# Patient Record
Sex: Female | Born: 1980 | Race: Black or African American | Hispanic: No | Smoking: Never smoker
Health system: Southern US, Community
[De-identification: ages and names within clinical notes are randomized; demographics above are authoritative.]

## PROBLEM LIST (undated history)

## (undated) DIAGNOSIS — L309 Dermatitis, unspecified: Secondary | ICD-10-CM

## (undated) HISTORY — PX: BUNIONECTOMY: SHX129

---

## 2005-08-08 HISTORY — PX: INDUCED ABORTION: SHX677

## 2014-12-19 ENCOUNTER — Ambulatory Visit
Admission: EM | Admit: 2014-12-19 | Discharge: 2014-12-19 | Disposition: A | Discharge status: Home / Self Care | Payer: BC Managed Care – PPO | Attending: Family Medicine | Admitting: Family Medicine

## 2014-12-19 DIAGNOSIS — N63 Unspecified lump in unspecified breast: Secondary | ICD-10-CM

## 2014-12-19 DIAGNOSIS — N6323 Unspecified lump in the left breast, lower outer quadrant: Secondary | ICD-10-CM

## 2014-12-19 DIAGNOSIS — N644 Mastodynia: Secondary | ICD-10-CM | POA: Diagnosis present

## 2014-12-19 HISTORY — DX: Dermatitis, unspecified: L30.9

## 2014-12-19 LAB — PREGNANCY, URINE: Preg Test, Ur: NEGATIVE

## 2014-12-19 NOTE — ED Notes (Addendum)
States when bent over 3 days ago and left breast "felt like a marble in it". + Painful. Previously negative mammogram. No discharge from nipple

## 2014-12-19 NOTE — ED Provider Notes (Addendum)
CSN: [id number]     Arrival date & time 12/19/14  31 History   First MD Initiated Contact with Patient 12/19/14 1920     Chief Complaint  Patient presents with  . Breast Pain   (Consider location/radiation/quality/duration/timing/severity/associated sxs/prior Treatment) HPI Comments: African [place] female new palpable lump left breast x 3 days.  Mid-menstrual cycle menses due in 2 weeks.  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  Reports left breast heaviness and discomfort with moving to her left and bending forward.  Unsure if previous mammogram.  Had regular women's exams with PCM in [place], [place] recently moved to [facility] and had not established new PCM.  Denied fever, chills, rash, erythema, fatigue, muscle aches, joint aches, swelling, personal or family history of fibrocystic breast disease, unknown cancer type in paternal aunt  The history is provided by the patient.    Past Medical History  Diagnosis Date  . Eczema    Past Surgical History  Procedure Laterality Date  . Bunionectomy    . Induced abortion  2007   History reviewed. No pertinent family history. History  Substance Use Topics  . Smoking status: Never Smoker   . Smokeless tobacco: Not on file  . Alcohol Use: Yes     Comment: socially   OB History    No data available     Review of Systems  Constitutional: Negative for fever, chills, diaphoresis, activity change, appetite change and fatigue.  HENT: Negative for mouth sores and trouble swallowing.   Eyes: Negative for photophobia, pain, discharge, redness, itching and visual disturbance.  Respiratory: Negative for cough, chest tightness, shortness of breath, wheezing and stridor.   Cardiovascular: Negative for chest pain, palpitations and leg swelling.  Gastrointestinal: Negative for nausea, diarrhea and constipation.  Endocrine: Negative for polydipsia, polyphagia and polyuria.  Genitourinary: Negative for  dysuria, vaginal bleeding, vaginal discharge, difficulty urinating and vaginal pain.  Musculoskeletal: Negative for myalgias, back pain, joint swelling, arthralgias, gait problem, neck pain and neck stiffness.  Skin: Negative for color change, pallor, rash and wound.  Allergic/Immunologic: Negative for environmental allergies and food allergies.  Neurological: Negative for syncope, light-headedness and headaches.  Hematological: Negative for adenopathy. Does not bruise/bleed easily.  Psychiatric/Behavioral: Negative for behavioral problems, confusion, sleep disturbance and agitation.    Allergies  Penicillins and Sulfa antibiotics  Home Medications   Prior to Admission medications   Medication Sig Start Date End Date Taking? Authorizing Provider  Calcium Carbonate-Vitamin D (CALCIUM-VITAMIN D) 500-200 MG-UNIT per tablet Take 1 tablet by mouth daily.   Yes Historical Provider, MD   BP 147/92 mmHg  Pulse 70  Temp(Src) 97.1 F (36.2 C) (Tympanic)  Resp 16  Ht 5\' 2"  (1.575 m)  Wt 205 lb (92.987 kg)  BMI 37.49 kg/m2  SpO2 100%  LMP 12/04/2014 (Approximate) Physical Exam  Constitutional: She is oriented to person, place, and time. Vital signs are normal. She appears well-developed and well-nourished. No distress.  HENT:  Head: Normocephalic and atraumatic.  Mouth/Throat: Oropharynx is clear and moist.  Eyes: Conjunctivae, EOM and lids are normal. Pupils are equal, round, and reactive to light.  Neck: Trachea normal and normal range of motion. Neck supple. No JVD present. No tracheal deviation present. No thyromegaly present.  Cardiovascular: Normal rate, regular rhythm, normal heart sounds and intact distal pulses.   Pulmonary/Chest: Effort normal and breath sounds normal. No respiratory distress. She has no wheezes. She has no rales. She exhibits no tenderness.  Abdominal: Soft. She exhibits no distension. There is no tenderness.  Genitourinary: There is breast tenderness. No breast  swelling, discharge or bleeding. Pelvic exam was performed with patient supine.  Left breast TTP 5 oclock position 1cm nodule mobile  Musculoskeletal: Normal range of motion. She exhibits no edema or tenderness.  Lymphadenopathy:    She has no cervical adenopathy.  Neurological: She is alert and oriented to person, place, and time.  Skin: Skin is warm, dry and intact. No rash noted. She is not diaphoretic. No erythema. No pallor.  Psychiatric: She has a normal mood and affect. Her speech is normal and behavior is normal. Judgment and thought content normal. Cognition and memory are normal.  Nursing note and vitals reviewed.   ED Course  Procedures (including critical care time) Labs Review Labs Reviewed  PREGNANCY, URINE    Imaging Review No results found. Discussed with patient probable breast cyst but definitive diagnosis can only occur with mammogram and/or ultrasound.  Unable to perform diagnostic mammograms at this location and scheduling closed until 22 Dec 2014.  Nurse will contact mammogram scheduling [facility] 16 May to schedule left diagnostic mammogram for patient and contact her with date/time. At [phone].  Discussed with patient if lump decreases in size after menstruation and decreasing caffeine intake probably benign/fibrocystic breast disease.  Consider breast surgeon referral if lump persists.  Patient given Dr Addison Bailey card as he is accepting new patient to establish PCM in local area.  Differential diagnosis malignancy, mastitis.  Discussed mastitis signs and symptoms with patient offered ceftin Rx and she refused at this time.  Patient to follow up for re-evaluation if erythema breast, nipple discharge, fever, myalgias, arthralgias, shortness of breath.  Patient verbalized understanding of information/instructions, agreed with plan of care and had no further questions at this time.  MDM   1. Breast lump on left side at 5 o'clock position   2. Lump in female breast    22 Dec 2014 0820 Telephone message left for patient diagnostic mammogram and ultrasound scheduled for 23 Dec 2014 at [place] and if she needs to change appt date/time to call [phone] to change appt time/date today and to contact me to confirm message receipt.  26 Dec 2014 at 0810 Telephone message left for patient to contact clinic 4.24mm likely benign cyst left breast recommend follow up US with PCM in 6 months.  Patient may request copy of report and I will leave at front desk and copy of imaging may be requesting from [facility] if needed.  Olen Cordial, NP 12/19/14 2043  Olen Cordial, NP 12/22/14 9833  Olen Cordial, NP 12/26/14 [phone]  Contacted patient via telephone discussed mammogram and ultrasound results (probably benign cyst diamester 4.39mm) with patient and recommend that patient has repeat US/mammogram in 6 months.  Continue self-breast exam and if changing in size follow up for re-evaluation sooner.  Discussed size may increase and decrease with menstrual cycle and caffeine intake.  Patient verbalized understanding of information/instructions, agreed with plan of care and had no further questions at this time.  Olen Cordial, NP 01/01/15 [phone]

## 2014-12-19 NOTE — Discharge Instructions (Signed)
Breast Cyst A breast cyst is a sac in the breast that is filled with fluid. Breast cysts are common in women. Women can have one or many cysts. When the breasts contain many cysts, it is usually due to a noncancerous (benign) condition called fibrocystic change. These lumps form under the influence of female hormones (estrogen and progesterone). The lumps are most often located in the upper, outer portion of the breast. They are often more swollen, painful, and tender before your period starts. They usually disappear after menopause, unless you are on hormone therapy.  There are several types of cysts:  Macrocyst. This is a cyst that is about 2 in. (5.1 cm) in diameter.   Microcyst. This is a tiny cyst that you cannot feel but can be seen with a mammogram or an ultrasound.   Galactocele. This is a cyst containing milk that may develop if you suddenly stop breastfeeding.   Sebaceous cyst of the skin. This type of cyst is not in the breast tissue itself. Breast cysts do not increase your risk of breast cancer. However, they must be monitored closely because they can be cancerous.  CAUSES  It is not known exactly what causes a breast cyst to form. Possible causes include:  An overgrowth of milk glands and connective tissue in the breast can block the milk glands, causing them to fill with fluid.   Scar tissue in the breast from previous surgery may block the glands, causing a cyst.  RISK FACTORS Estrogen may influence the development of a breast cyst.  SIGNS AND SYMPTOMS   Feeling a smooth, round, soft lump (like a grape) in the breast that is easily moveable.   Breast discomfort or pain.  Increase in size of the lump before your menstrual period and decrease in its size after your menstrual period.  DIAGNOSIS  A cyst can be felt during a physical exam by your health care provider. A breast X-ray exam (mammogram) and ultrasonography will be done to confirm the diagnosis. Fluid may  be removed from the cyst with a needle (fine needle aspiration) to make sure the cyst is not cancerous.  TREATMENT  Treatment may not be necessary. Your health care provider may monitor the cyst to see if it goes away on its own. If treatment is needed, it may include:  Hormone treatment.   Needle aspiration. There is a chance of the cyst coming back after aspiration.   Surgery to remove the whole cyst.  HOME CARE INSTRUCTIONS   Keep all follow-up appointments with your health care provider.  See your health care provider regularly:  Get a yearly exam by your health care provider.  Have a clinical breast exam by a health care provider every 1-3 years if you are 4-49 years of age. After age 70 years, you should have the exam every year.   Get mammogram tests as directed by your health care provider.   Understand the normal appearance and feel of your breasts and perform breast self-exams.   Only take over-the-counter or prescription medicines as directed by your health care provider.   Wear a supportive bra, especially when exercising.   Avoid caffeine.   Reduce your salt intake, especially before your menstrual period. Too much salt can cause fluid retention, breast swelling, and discomfort.  SEEK MEDICAL CARE IF:   You feel, or think you feel, a lump in your breast.   You notice that both breasts look or feel different than usual.   Your  breast is still causing pain after your menstrual period is over.   You need medicine for breast pain and swelling that occurs with your menstrual period.  SEEK IMMEDIATE MEDICAL CARE IF:   You have severe pain, tenderness, redness, or warmth in your breast.   You have nipple discharge or bleeding.   Your breast lump becomes hard and painful.   You find new lumps or bumps that were not there before.   You feel lumps in your armpit (axilla).   You notice dimpling or wrinkling of the breast or nipple.   You  have a fever.  MAKE SURE YOU:  Understand these instructions.  Will watch your condition.  Will get help right away if you are not doing well or get worse. Document Released: 07/25/2005 Document Revised: 03/27/2013 Document Reviewed: 02/21/2013 [facility] Patient Information 2015 [place], [place]. This information is not intended to replace advice given to you by your health care provider. Make sure you discuss any questions you have with your health care provider. PATIENT INSTRUCTIONS FIBROCYSTIC BREAST DISEASE  FOLLOW-UP:  Please make an appointment with your physician in 7 day(s).  Call your physician should you develop a new breast mass that is different, if one particular lump starts to enlarge, or if nipple discharge develops.   CAUSE:  Many women have some lumpiness within their breasts and these areas at times can become tender during certain times in your menstrual cycle.  These areas tend to feel like a firm rubber ball as compared to a cancer which will more commonly feel hard and almost rock-like.  Fibrocystic breast disease does not in and of itself increase your risk for breast cancer but you should be sure to examine yiour breasts at the same time of the month on a monthly basis.  If there are a lot of areas of lumpiness you should tape a piece of paper on the mirror with a diagram of your breasts, noting where the areas of lumpiness are and their relative size.  You can then refer to this diagram on a monthly basis to keep better track of any changes should they occur.  DIET:  You should try and avoid foods, or at least minimize foods, such as chocolate and caffeine which may cause the symptoms of tenderness to become worse.  ACTIVITY:  You may want to wear a bra that offers additional support, and/or consider a sports bra, especially during those times when your breasts are more tender.  MEDICATIONS:  Taking Vitamin E capsules twice a day along with Evening of Primrose Capsules  three times a day, or as directed on the bottle, may help your symptoms.  These are both available over-the-counter and without a prescription. There is clinical evidence that these may help symptoms in some patients.   If your physician has prescribed medication for your fibrocystic breast disease, be sure to take it as instructed on the bottle and let him/her know if you have any side effects.  QUESTIONS:  Please feel free to call your physician  if you have any questions, and they will be glad to assist you.

## 2014-12-23 ENCOUNTER — Ambulatory Visit: Admission: RE | Admit: 2014-12-23 | Payer: BC Managed Care – PPO | Source: Ambulatory Visit

## 2014-12-23 ENCOUNTER — Ambulatory Visit
Admission: RE | Admit: 2014-12-23 | Discharge: 2014-12-23 | Disposition: A | Discharge status: Home / Self Care | Payer: BC Managed Care – PPO | Source: Ambulatory Visit | Attending: Registered Nurse | Admitting: Registered Nurse

## 2014-12-23 DIAGNOSIS — N63 Unspecified lump in breast: Secondary | ICD-10-CM | POA: Insufficient documentation

## 2019-10-05 ENCOUNTER — Ambulatory Visit: Payer: BC Managed Care – PPO | Attending: Internal Medicine

## 2019-10-05 ENCOUNTER — Ambulatory Visit: Payer: BC Managed Care – PPO

## 2019-10-05 DIAGNOSIS — Z23 Encounter for immunization: Secondary | ICD-10-CM

## 2019-10-05 NOTE — Progress Notes (Signed)
   Covid-19 Vaccination Clinic  Name:  Sheila Martinez    MRN: [id number] DOB: 19-May-1981  10/05/2019  [name] was observed post Covid-19 immunization for 15 minutes without incidence. She was provided with Vaccine Information Sheet and instruction to access the V-Safe system.   [name] was instructed to call 911 with any severe reactions post vaccine: Marland Kitchen Difficulty breathing  . Swelling of your face and throat  . A fast heartbeat  . A bad rash all over your body  . Dizziness and weakness    Immunizations Administered    Name Date Dose VIS Date Route   Moderna COVID-19 Vaccine 10/05/2019  4:13 PM 0.5 mL 07/09/2019 Intramuscular   Manufacturer: Moderna   Lot: [id number]   [id number]

## 2019-11-02 ENCOUNTER — Ambulatory Visit: Payer: BC Managed Care – PPO | Attending: Internal Medicine

## 2019-11-02 DIAGNOSIS — Z23 Encounter for immunization: Secondary | ICD-10-CM

## 2020-10-15 ENCOUNTER — Other Ambulatory Visit: Payer: Self-pay

## 2020-10-15 DIAGNOSIS — D259 Leiomyoma of uterus, unspecified: Secondary | ICD-10-CM | POA: Diagnosis not present

## 2020-10-15 DIAGNOSIS — R102 Pelvic and perineal pain: Secondary | ICD-10-CM | POA: Diagnosis present

## 2020-10-15 LAB — URINALYSIS, COMPLETE (UACMP) WITH MICROSCOPIC
Bacteria, UA: NONE SEEN
Bilirubin Urine: NEGATIVE
Glucose, UA: NEGATIVE mg/dL
Hgb urine dipstick: NEGATIVE
Ketones, ur: NEGATIVE mg/dL
Leukocytes,Ua: NEGATIVE
Nitrite: NEGATIVE
Protein, ur: NEGATIVE mg/dL
Specific Gravity, Urine: 1.013 (ref 1.005–1.030)
pH: 7 (ref 5.0–8.0)

## 2020-10-15 LAB — COMPREHENSIVE METABOLIC PANEL
ALT: 15 U/L (ref 0–44)
AST: 23 U/L (ref 15–41)
Albumin: 4.3 g/dL (ref 3.5–5.0)
Alkaline Phosphatase: 77 U/L (ref 38–126)
Anion gap: 12 (ref 5–15)
BUN: 15 mg/dL (ref 6–20)
CO2: 24 mmol/L (ref 22–32)
Calcium: 9.3 mg/dL (ref 8.9–10.3)
Chloride: 100 mmol/L (ref 98–111)
Creatinine, Ser: 0.94 mg/dL (ref 0.44–1.00)
GFR, Estimated: >60 mL/min (ref >60)
Glucose, Bld: 119 mg/dL — ABNORMAL HIGH (ref 70–99)
Potassium: 3.3 mmol/L — ABNORMAL LOW (ref 3.5–5.1)
Sodium: 136 mmol/L (ref 135–145)
Total Bilirubin: 0.5 mg/dL (ref 0.3–1.2)
Total Protein: 8.2 g/dL — ABNORMAL HIGH (ref 6.5–8.1)

## 2020-10-15 LAB — CBC
HCT: 41.4 % (ref 36.0–46.0)
Hemoglobin: 13 g/dL (ref 12.0–15.0)
MCH: 25.7 pg — ABNORMAL LOW (ref 26.0–34.0)
MCHC: 31.4 g/dL (ref 30.0–36.0)
MCV: 81.8 fL (ref 80.0–100.0)
Platelets: 335 10*3/uL (ref 150–400)
RBC: 5.06 MIL/uL (ref 3.87–5.11)
RDW: 14.6 % (ref 11.5–15.5)
WBC: 6.3 10*3/uL (ref 4.0–10.5)
nRBC: 0 % (ref 0.0–0.2)

## 2020-10-15 LAB — POC URINE PREG, ED: Preg Test, Ur: NEGATIVE

## 2020-10-15 NOTE — ED Triage Notes (Signed)
Pt in with RLQ cramping during her menses monthly. States started in September and has progressively gotten worse. Pt on cycle now and having same pain.

## 2020-10-16 ENCOUNTER — Emergency Department: Payer: BC Managed Care – PPO

## 2020-10-16 ENCOUNTER — Emergency Department
Admission: EM | Admit: 2020-10-16 | Discharge: 2020-10-16 | Disposition: A | Discharge status: Home / Self Care | Payer: BC Managed Care – PPO | Attending: Emergency Medicine | Admitting: Emergency Medicine

## 2020-10-16 DIAGNOSIS — D259 Leiomyoma of uterus, unspecified: Secondary | ICD-10-CM

## 2020-10-16 DIAGNOSIS — R1031 Right lower quadrant pain: Secondary | ICD-10-CM

## 2020-10-16 DIAGNOSIS — N946 Dysmenorrhea, unspecified: Secondary | ICD-10-CM

## 2020-10-16 MED ORDER — KETOROLAC TROMETHAMINE 10 MG PO TABS: 10.0000 mg | ORAL_TABLET | Freq: Four times a day (QID) | ORAL | 0 refills | Status: AC | PRN

## 2020-10-16 MED ORDER — KETOROLAC TROMETHAMINE 30 MG/ML IJ SOLN
30.0000 mg | Freq: Once | INTRAMUSCULAR | Status: AC
  Administered 2020-10-16: 30 mg via INTRAVENOUS
  Filled 2020-10-16: qty 1

## 2020-10-16 NOTE — ED Provider Notes (Signed)
[facility] Emergency Department Provider Note  ____________________________________________   Event Date/Time   First MD Initiated Contact with Patient 10/16/20 0216     (approximate)  I have reviewed the triage vital signs and the nursing notes.   HISTORY  Chief Complaint Abdominal Pain    HPI Sheila Martinez is a 40 y.o. female with history of eczema who presents to the emergency department with right-sided pelvic pain.  She states since she was 40 years old she has had lower abdominal pain with her menstrual cycles but since September 2021 she has had severe right lower quadrant abdominal pain that has progressively gotten worse with her menstrual cycles.  She states she is having a menstrual cycle now.  Her cycles are regular.  She denies any discharge.  No fevers, vomiting, diarrhea.  Does have some dysuria.  States she saw her OB/GYN with [facility] in December and had negative STD screening.  She states she has been taking ibuprofen 600 mg for pain without relief.  She states that she is having pain with urinating, walking and is uncontrollable.  She states she is having a hard time working due to pain.  She denies any previous history of endometriosis.  On review of her records, it appears she saw her primary care physician Dr. Sheral Flow on 07/28/2020 and patient was treated with Macrobid for UTI.  She saw Dagoberto Reef, certified nurse midwife on 08/31/2020 for vulvar itching.  They discussed prophylactic boric acid for BV.  Her wet prep at that time was negative.  UA showed many bacteria with a culture positive for Proteus.  It is unclear if she was ever started on antibiotics.  It appears she has had trichomonas in 2020.  She has history of HSV.  She had an abnormal Pap smear in January 2021 that was  LSIL with negative HPV>  she is a G2 P2.  She states she is not concerned for STDs.  She is monogamous with one female partner.        Past Medical  History:  Diagnosis Date  . Eczema     There are no problems to display for this patient.   Past Surgical History:  Procedure Laterality Date  . BUNIONECTOMY    . INDUCED ABORTION  2007    Prior to Admission medications   Medication Sig Start Date End Date Taking? Authorizing Provider  ketorolac (TORADOL) 10 MG tablet Take 1 tablet (10 mg total) by mouth every 6 (six) hours as needed. Take with food. 10/16/20  Yes Kreston Ahrendt, Delice Bison, DO  Calcium Carbonate-Vitamin D (CALCIUM-VITAMIN D) 500-200 MG-UNIT per tablet Take 1 tablet by mouth daily.    [provider]    Allergies Penicillins and Sulfa antibiotics  No family history on file.  Social History Social History   Tobacco Use  . Smoking status: Never Smoker  Substance Use Topics  . Alcohol use: Yes    Comment: socially    Review of Systems Constitutional: No fever. Eyes: No visual changes. ENT: No sore throat. Cardiovascular: Denies chest pain. Respiratory: Denies shortness of breath. Gastrointestinal: No nausea, vomiting, diarrhea. Genitourinary: Negative for dysuria. Musculoskeletal: Negative for back pain. Skin: Negative for rash. Neurological: Negative for focal weakness or numbness.  ____________________________________________   PHYSICAL EXAM:  VITAL SIGNS: ED Triage Vitals  Enc Vitals Group     BP 10/15/20 1928 (!) 160/111     Pulse Rate 10/15/20 1928 98     Resp 10/15/20 1928 20  Temp 10/15/20 1928 98.7 F (37.1 C)     Temp Source 10/15/20 1928 Oral     SpO2 10/15/20 1928 100 %     Weight 10/15/20 1929 205 lb (93 kg)     Height 10/15/20 1929 5\' 3"  (1.6 m)     Head Circumference --      Peak Flow --      Pain Score 10/15/20 1928 6     Pain Loc --      Pain Edu? --      Excl. in [place]? --    CONSTITUTIONAL: Alert and oriented and responds appropriately to questions. Well-appearing; well-nourished, tearful after abdominal exam HEAD: Normocephalic EYES: Conjunctivae clear, pupils  appear equal, EOM appear intact ENT: normal nose; moist mucous membranes NECK: Supple, normal ROM CARD: RRR; S1 and S2 appreciated; no murmurs, no clicks, no rubs, no gallops RESP: Normal chest excursion without splinting or tachypnea; breath sounds clear and equal bilaterally; no wheezes, no rhonchi, no rales, no hypoxia or respiratory distress, speaking full sentences ABD/GI: Normal bowel sounds; non-distended; soft, ender to palpation in the right lower quadrant without guarding or rebound GU: Deferred per patient request. BACK: The back appears normal EXT: Normal ROM in all joints; no deformity noted, no edema; no cyanosis SKIN: Normal color for age and race; warm; no rash on exposed skin NEURO: Moves all extremities equally PSYCH: The patient's mood and manner are appropriate.  ____________________________________________   LABS (all labs ordered are listed, but only abnormal results are displayed)  Labs Reviewed  CBC - Abnormal; Notable for the following components:      Result Value   MCH 25.7 (*)    All other components within normal limits  COMPREHENSIVE METABOLIC PANEL - Abnormal; Notable for the following components:   Potassium 3.3 (*)    Glucose, Bld 119 (*)    Total Protein 8.2 (*)    All other components within normal limits  URINALYSIS, COMPLETE (UACMP) WITH MICROSCOPIC - Abnormal; Notable for the following components:   Color, Urine STRAW (*)    APPearance CLEAR (*)    All other components within normal limits  URINE CULTURE  POC URINE PREG, ED   ____________________________________________  EKG  none ____________________________________________  RADIOLOGY I, Rosalina Dingwall, personally viewed and evaluated these images (plain radiographs) as part of my medical decision making, as well as reviewing the written report by the radiologist.  ED MD interpretation: Uterine fibroids.  Official radiology report(s): US PELVIC COMPLETE W TRANSVAGINAL AND TORSION  R/O  Result Date: 10/16/2020 CLINICAL DATA:  Right pelvic pain, LMP 10/15/2020 EXAM: TRANSABDOMINAL AND TRANSVAGINAL ULTRASOUND OF PELVIS DOPPLER ULTRASOUND OF OVARIES TECHNIQUE: Both transabdominal and transvaginal ultrasound examinations of the pelvis were performed. Transabdominal technique was performed for global imaging of the pelvis including uterus, ovaries, adnexal regions, and pelvic cul-de-sac. It was necessary to proceed with endovaginal exam following the transabdominal exam to visualize the endometrium. Color and duplex Doppler ultrasound was utilized to evaluate blood flow to the ovaries. COMPARISON:  None. FINDINGS: Uterus Measurements: 11.8 x 8.0 x 14.3 cm = volume: 703 mL. The uterus is enlarged and demonstrates lobular contour with multiple intrauterine heterogeneously hypoechoic masses identified in keeping with multiple uterine fibroids. The dominant mass is seen within the fundus measuring 10.0 x 5.8 x 7.3 cm. A second large intramural/exophytic mass is seen measuring 7.9 x 5.8 x 7.2 cm. Multiple nabothian cysts are seen within the cervix. Endometrium Thickness: 6 mm. The endometrial stripe is obscured within the  fundus secondary to multiple uterine fibroids. Within the mid body and lower uterine segment, the endometrial stripe appears uniform. Right ovary Measurements: 4.3 x 2.4 x 2.9 cm = volume: 15 mL. Normal appearance/no adnexal mass. Left ovary Measurements: 3.5 x 1.4 x 2.8 cm = volume: 7 mL. Normal appearance/no adnexal mass. Pulsed Doppler evaluation of both ovaries demonstrates normal low-resistance arterial and venous waveforms. Other findings No abnormal free fluid. IMPRESSION: Multiple uterine fibroids resulting in distortion of the uterine fundus. Electronically Signed   By: Fidela Salisbury MD   On: 10/16/2020 05:14    ____________________________________________   PROCEDURES  Procedure(s) performed (including Critical  Care):  Procedures  ____________________________________________   INITIAL IMPRESSION / ASSESSMENT AND PLAN / ED COURSE  As part of my medical decision making, I reviewed the following data within the [place] notes reviewed and incorporated, Labs reviewed , Old chart reviewed and Notes from prior ED visits         Patient here with chronic pelvic pain.  Will obtain transvaginal ultrasound today to rule out torsion given worsening pain at this time.  Recent negative STD screening 08/03/2020.  Low suspicion for TOA.  Pregnancy test today is negative.  She did have a positive urine culture on August 31, 2020.  Urine grew Proteus mirabilis but unclear if she was ever on antibiotics.  Her urine does not appear infected today and she is not describing significant urinary tract infection symptoms.  Seems to be more chronic pelvic pain worse with menstrual cycles.  Discussed with patient that this could also be endometriosis and have recommended continued close follow-up with her OB/GYN.  Will give Toradol for pain and reassess.  Symptoms very atypical for appendicitis.  Doubt kidney stone.  ED PROGRESS  5:08 AM  Pt reports significant pain relief with ketorolac.  Ultrasound results pending.  5:21 AM  Pt's ultrasound shows multiple uterine fibroids.  Ovaries show no adnexal masses, lesions with good blood flow to both ovaries.  Suspect fibroids are causing her pain with her menstrual cycles.  Have recommended she follow-up with her OB/GYN.  Given ketorolac help with her pain significantly, will discharge with prescription of the same.  Discussed return precautions.  Patient comfortable with this plan.   At this time, I do not feel there is any life-threatening condition present. I have reviewed, interpreted and discussed all results (EKG, imaging, lab, urine as appropriate) and exam findings with patient/family. I have reviewed nursing notes and appropriate previous  records.  I feel the patient is safe to be discharged home without further emergent workup and can continue workup as an outpatient as needed. Discussed usual and customary return precautions. Patient/family verbalize understanding and are comfortable with this plan.  Outpatient follow-up has been provided as needed. All questions have been answered.  ____________________________________________   FINAL CLINICAL IMPRESSION(S) / ED DIAGNOSES  Final diagnoses:  Dysmenorrhea  Uterine leiomyoma, unspecified location     ED Discharge Orders         Ordered    ketorolac (TORADOL) 10 MG tablet  Every 6 hours PRN        10/16/20 0510          *Please note:  Adilee Lemme was evaluated in Emergency Department on 10/16/2020 for the symptoms described in the history of present illness. She was evaluated in the context of the global COVID-19 pandemic, which necessitated consideration that the patient might be at risk for infection with the SARS-CoV-2 virus that  causes COVID-19. Institutional protocols and algorithms that pertain to the evaluation of patients at risk for COVID-19 are in a state of rapid change based on information released by regulatory bodies including the CDC and federal and state organizations. These policies and algorithms were followed during the patient's care in the ED.  Some ED evaluations and interventions may be delayed as a result of limited staffing during and the pandemic.*   Note:  This document was prepared using Dragon voice recognition software and may include unintentional dictation errors.   Kenrick Pore, Delice Bison, DO 10/16/20 Delrae Rend

## 2020-10-17 LAB — URINE CULTURE: Culture: <10000 — AB

## 2021-07-23 IMAGING — US US PELVIS COMPLETE TRANSABD/TRANSVAG W DUPLEX
1 series · 13 of 25 positions shown · non-contrast
Comparison: None.

CLINICAL DATA: Right pelvic pain, LMP 10/15/2020

EXAM:
TRANSABDOMINAL AND TRANSVAGINAL ULTRASOUND OF PELVIS
DOPPLER ULTRASOUND OF OVARIES
TECHNIQUE: Both transabdominal and transvaginal ultrasound examinations of the
pelvis were performed. Transabdominal technique was performed for
global imaging of the pelvis including uterus, ovaries, adnexal
regions, and pelvic cul-de-sac.
It was necessary to proceed with endovaginal exam following the
transabdominal exam to visualize the endometrium. Color and duplex
Doppler ultrasound was utilized to evaluate blood flow to the
ovaries.

[Series 1: us pelvic complete w transvaginal and torsion righ · 13 of 100 slices shown]
[im 1/100]
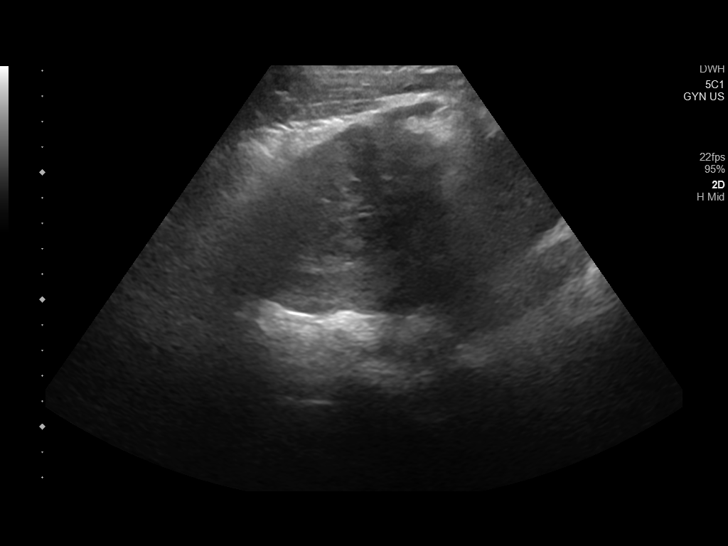
[im 9/100]
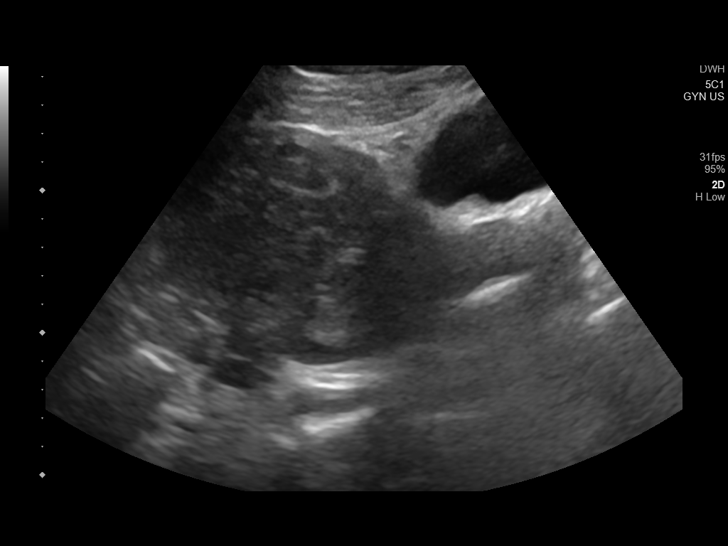
[im 17/100]
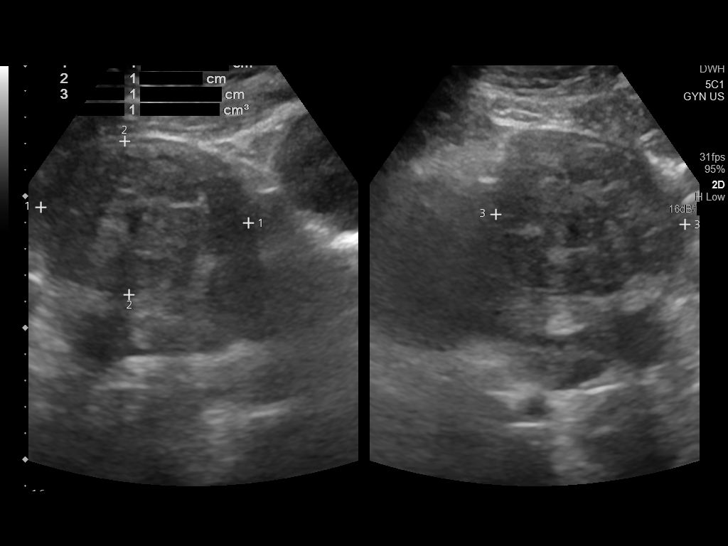
[im 25/100]
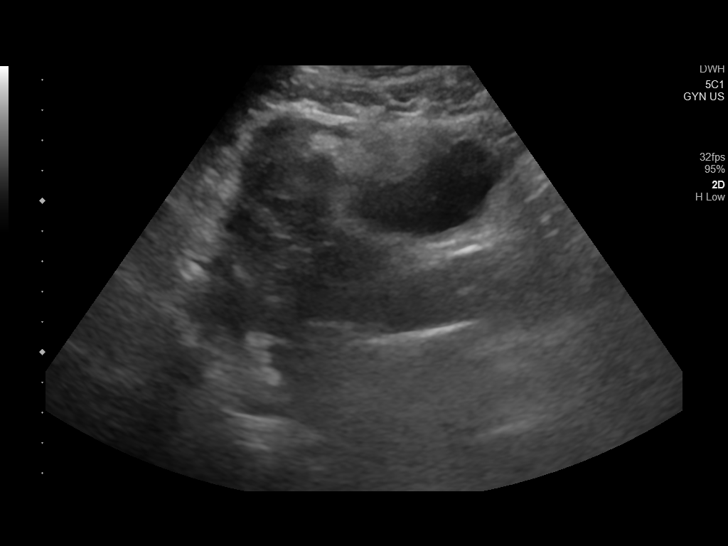
[im 34/100]
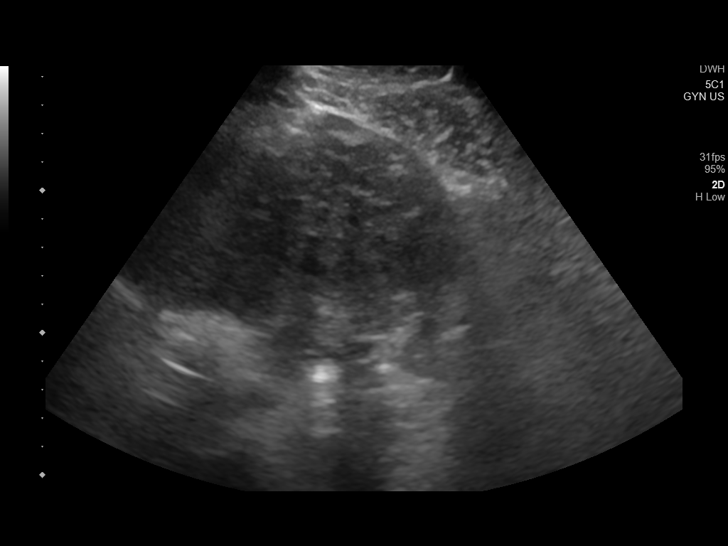
[im 42/100]
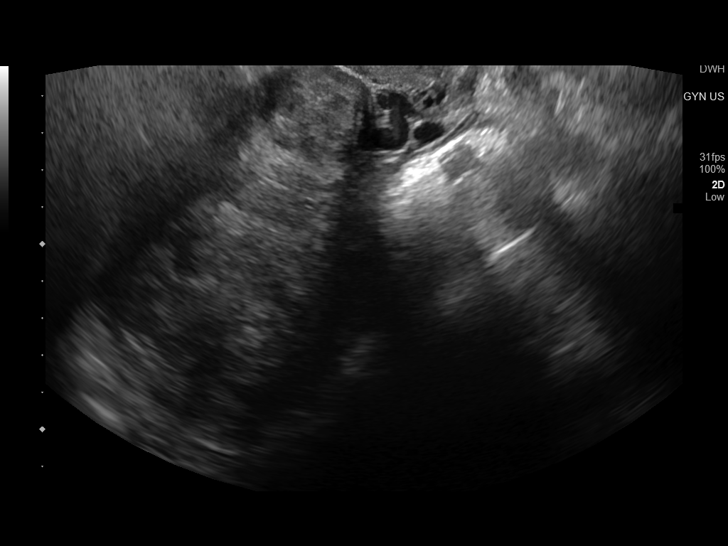
[im 50/100]
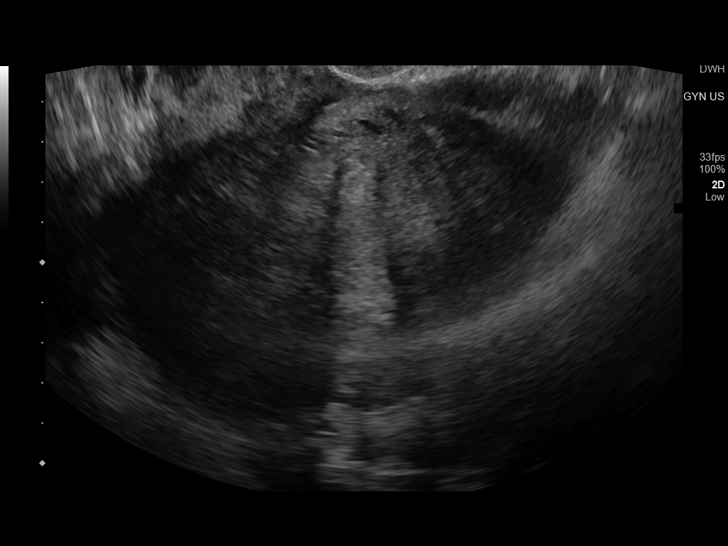
[im 58/100]
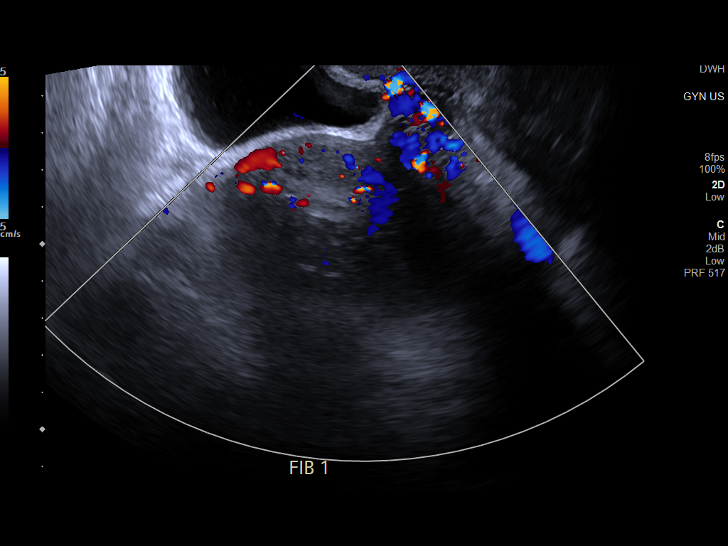
[im 67/100]
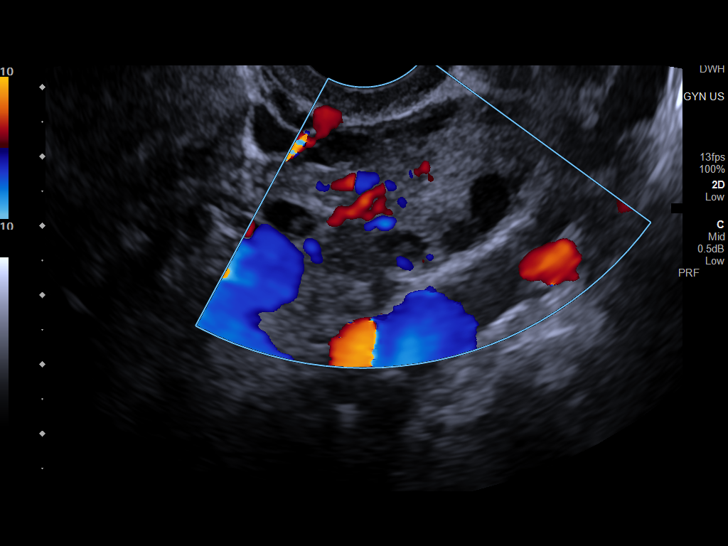
[im 75/100]
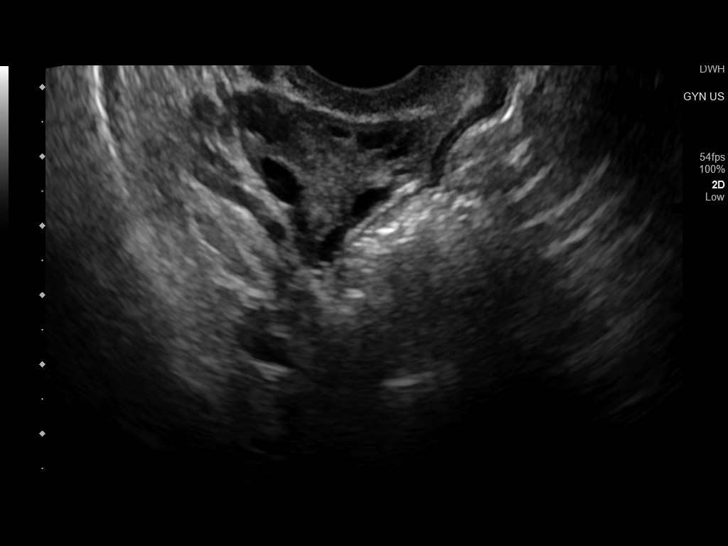
[im 83/100]
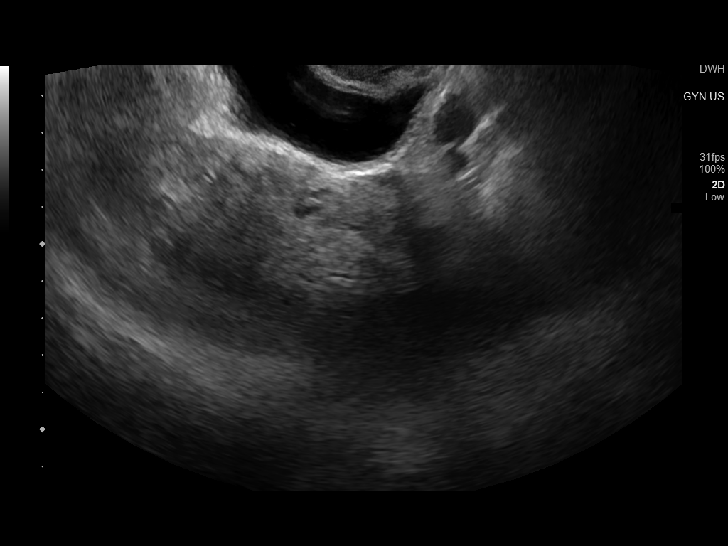
[im 91/100]
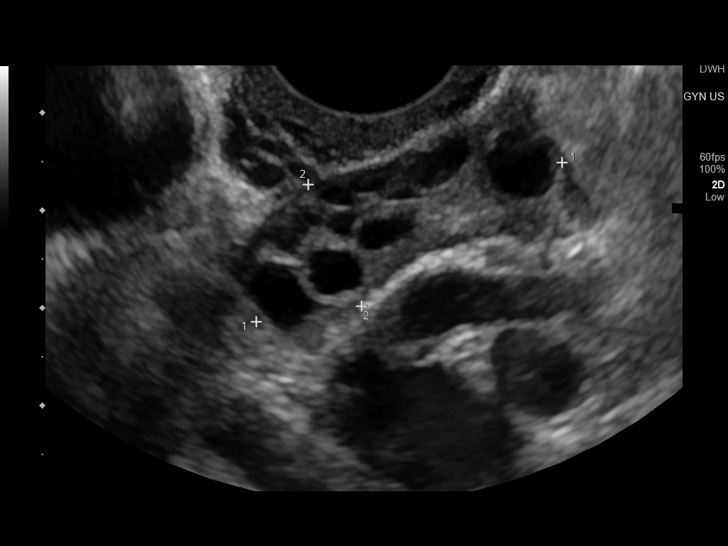
[im 100/100]
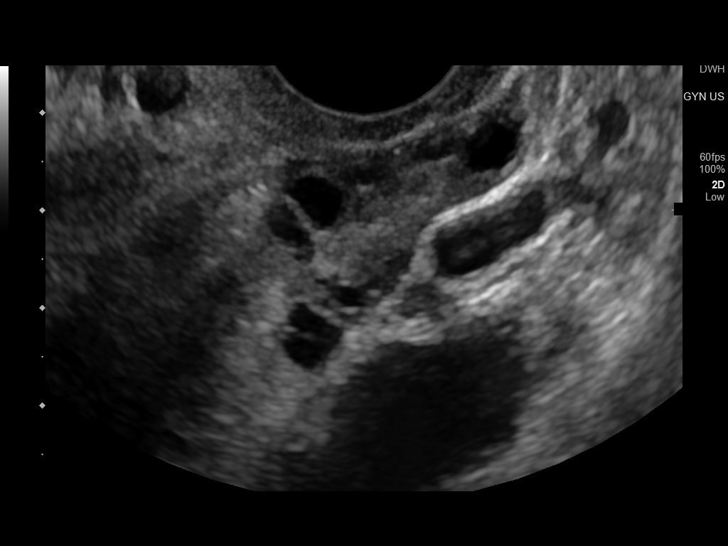

[13 of 25 positions shown; findings below may reference images not displayed]

FINDINGS: Uterus

Measurements: 11.8 x 8.0 x 14.3 cm = volume: 703 mL. The uterus is
enlarged and demonstrates lobular contour with multiple intrauterine
heterogeneously hypoechoic masses identified in keeping with
multiple uterine fibroids. The dominant mass is seen within the
fundus measuring 10.0 x 5.8 x 7.3 cm. A second large
intramural/exophytic mass is seen measuring 7.9 x 5.8 x 7.2 cm.
Multiple nabothian cysts are seen within the cervix.

Endometrium

Thickness: 6 mm. The endometrial stripe is obscured within the
fundus secondary to multiple uterine fibroids. Within the mid body
and lower uterine segment, the endometrial stripe appears uniform.

Right ovary

Measurements: 4.3 x 2.4 x 2.9 cm = volume: 15 mL. Normal
appearance/no adnexal mass.

Left ovary

Measurements: 3.5 x 1.4 x 2.8 cm = volume: 7 mL. Normal
appearance/no adnexal mass.

Pulsed Doppler evaluation of both ovaries demonstrates normal
low-resistance arterial and venous waveforms.

Other findings

No abnormal free fluid.
IMPRESSION: Multiple uterine fibroids resulting in distortion of the uterine
fundus.
